# Patient Record
Sex: Female | Born: 2000 | Race: White | Hispanic: No | Marital: Single | Smoking: Never smoker
Health system: Southern US, Community
[De-identification: ages and names within clinical notes are randomized; demographics above are authoritative.]

## PROBLEM LIST (undated history)

## (undated) DIAGNOSIS — Z789 Other specified health status: Secondary | ICD-10-CM

---

## 2021-03-16 ENCOUNTER — Other Ambulatory Visit: Payer: Self-pay

## 2021-03-16 ENCOUNTER — Ambulatory Visit
Admission: EM | Admit: 2021-03-16 | Discharge: 2021-03-16 | Disposition: A | Discharge status: Home / Self Care | Payer: BLUE CROSS/BLUE SHIELD | Attending: Emergency Medicine | Admitting: Emergency Medicine

## 2021-03-16 ENCOUNTER — Emergency Department: Payer: BLUE CROSS/BLUE SHIELD

## 2021-03-16 ENCOUNTER — Emergency Department
Admission: EM | Admit: 2021-03-16 | Discharge: 2021-03-16 | Disposition: A | Discharge status: Home / Self Care | Payer: BLUE CROSS/BLUE SHIELD | Attending: Emergency Medicine | Admitting: Emergency Medicine

## 2021-03-16 ENCOUNTER — Encounter: Payer: Self-pay | Admitting: Emergency Medicine

## 2021-03-16 DIAGNOSIS — J351 Hypertrophy of tonsils: Secondary | ICD-10-CM

## 2021-03-16 DIAGNOSIS — J039 Acute tonsillitis, unspecified: Secondary | ICD-10-CM | POA: Insufficient documentation

## 2021-03-16 DIAGNOSIS — J029 Acute pharyngitis, unspecified: Secondary | ICD-10-CM

## 2021-03-16 HISTORY — DX: Other specified health status: Z78.9

## 2021-03-16 LAB — COMPREHENSIVE METABOLIC PANEL
ALT: 13 U/L (ref 0–44)
AST: 14 U/L — ABNORMAL LOW (ref 15–41)
Albumin: 4.5 g/dL (ref 3.5–5.0)
Alkaline Phosphatase: 62 U/L (ref 38–126)
Anion gap: 11 (ref 5–15)
BUN: 9 mg/dL (ref 6–20)
CO2: 23 mmol/L (ref 22–32)
Calcium: 9.5 mg/dL (ref 8.9–10.3)
Chloride: 101 mmol/L (ref 98–111)
Creatinine, Ser: 0.62 mg/dL (ref 0.44–1.00)
GFR, Estimated: >60 mL/min (ref >60)
Glucose, Bld: 114 mg/dL — ABNORMAL HIGH (ref 70–99)
Potassium: 4 mmol/L (ref 3.5–5.1)
Sodium: 135 mmol/L (ref 135–145)
Total Bilirubin: 1.5 mg/dL — ABNORMAL HIGH (ref 0.3–1.2)
Total Protein: 8.7 g/dL — ABNORMAL HIGH (ref 6.5–8.1)

## 2021-03-16 LAB — CBC WITH DIFFERENTIAL/PLATELET
Abs Immature Granulocytes: 0.1 10*3/uL — ABNORMAL HIGH (ref 0.00–0.07)
Basophils Absolute: 0.1 10*3/uL (ref 0.0–0.1)
Basophils Relative: 0 %
Eosinophils Absolute: 0 10*3/uL (ref 0.0–0.5)
Eosinophils Relative: 0 %
HCT: 42.2 % (ref 36.0–46.0)
Hemoglobin: 15.7 g/dL — ABNORMAL HIGH (ref 12.0–15.0)
Immature Granulocytes: 1 %
Lymphocytes Relative: 9 %
Lymphs Abs: 2 10*3/uL (ref 0.7–4.0)
MCH: 35.2 pg — ABNORMAL HIGH (ref 26.0–34.0)
MCHC: 37.2 g/dL — ABNORMAL HIGH (ref 30.0–36.0)
MCV: 94.6 fL (ref 80.0–100.0)
Monocytes Absolute: 2.2 10*3/uL — ABNORMAL HIGH (ref 0.1–1.0)
Monocytes Relative: 10 %
Neutro Abs: 17.8 10*3/uL — ABNORMAL HIGH (ref 1.7–7.7)
Neutrophils Relative %: 80 %
Platelets: 276 10*3/uL (ref 150–400)
RBC: 4.46 MIL/uL (ref 3.87–5.11)
RDW: 11.2 % — ABNORMAL LOW (ref 11.5–15.5)
WBC: 22.2 10*3/uL — ABNORMAL HIGH (ref 4.0–10.5)
nRBC: 0 % (ref 0.0–0.2)

## 2021-03-16 LAB — MONONUCLEOSIS SCREEN: Mono Screen: NEGATIVE

## 2021-03-16 LAB — GROUP A STREP BY PCR: Group A Strep by PCR: NOT DETECTED

## 2021-03-16 IMAGING — CT CT NECK W/ CM
3 of 4 series · 12 of 33 positions shown, 14 images · IV contrast (omnipaque)
Comparison: None.

CLINICAL DATA: Sore throat, swollen tonsils

EXAM:
CT NECK WITH CONTRAST
TECHNIQUE: Multidetector CT imaging of the neck was performed using the
standard protocol following the bolus administration of intravenous
contrast.
CONTRAST:  75mL OMNIPAQUE IOHEXOL 350 MG/ML SOLN

[Series 2: axial neck · axial · 0.49mm/px · z∈[+237,+397]mm · 4 of 121 slices shown, 5 images]
[im 21/121  soft-tissue]
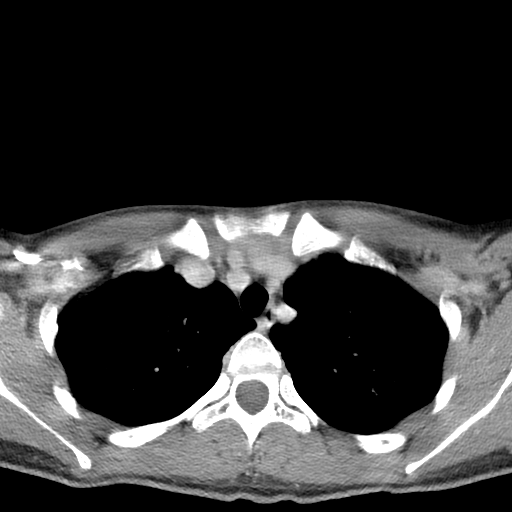
[im 21/121  bone]
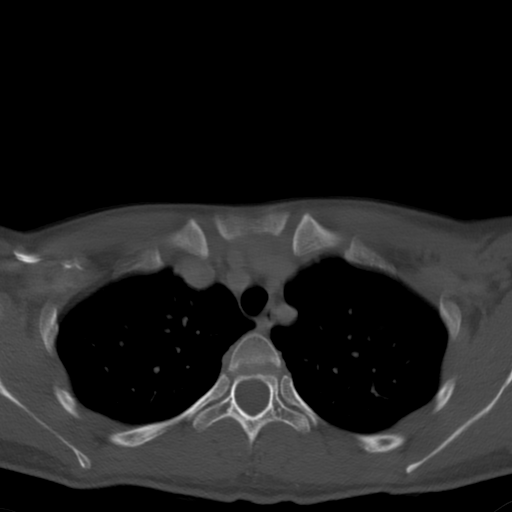
[im 41/121  bone]
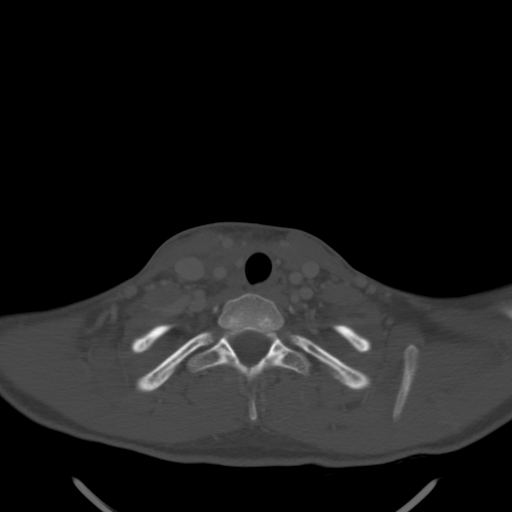
[im 81/121  bone]
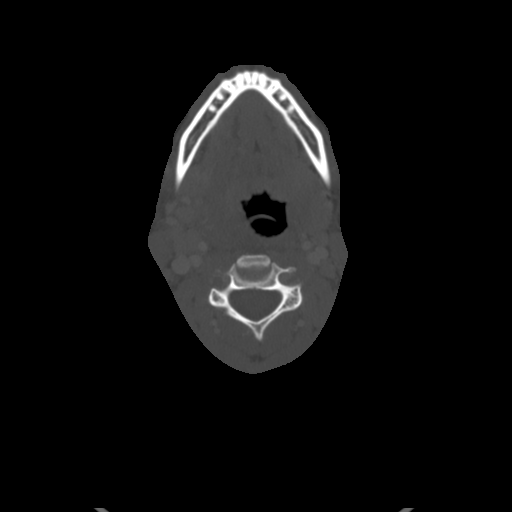
[im 101/121  bone]
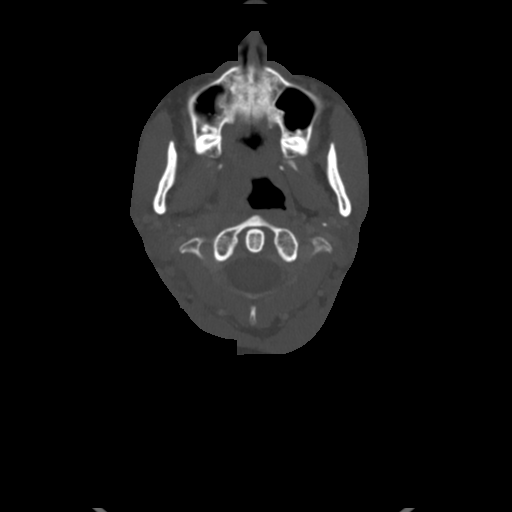

[Series 5: sag neck · sagittal · 0.37mm/px · 5 of 69 slices shown, 6 images]
[im 23/69  bone]
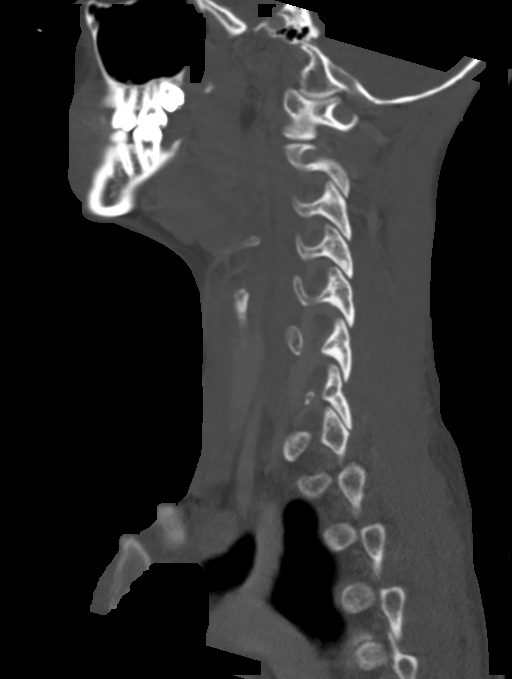
[im 29/69  bone]
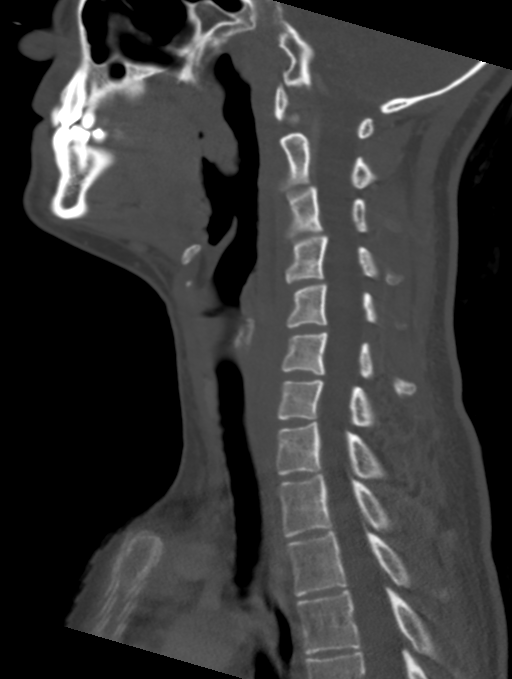
[im 35/69  soft-tissue]
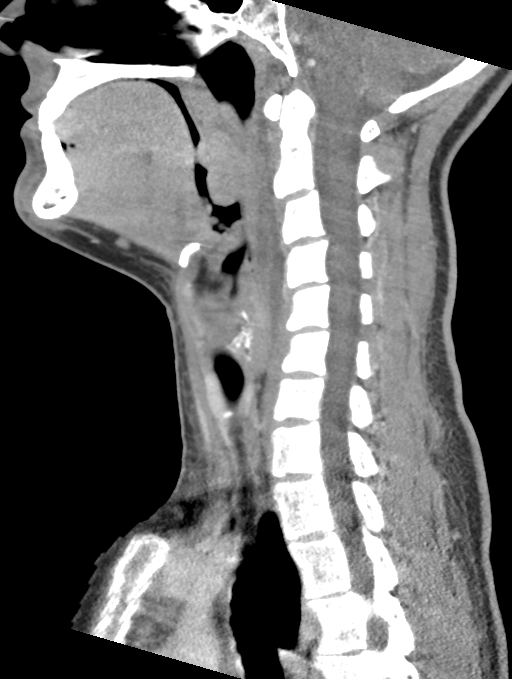
[im 35/69  bone]
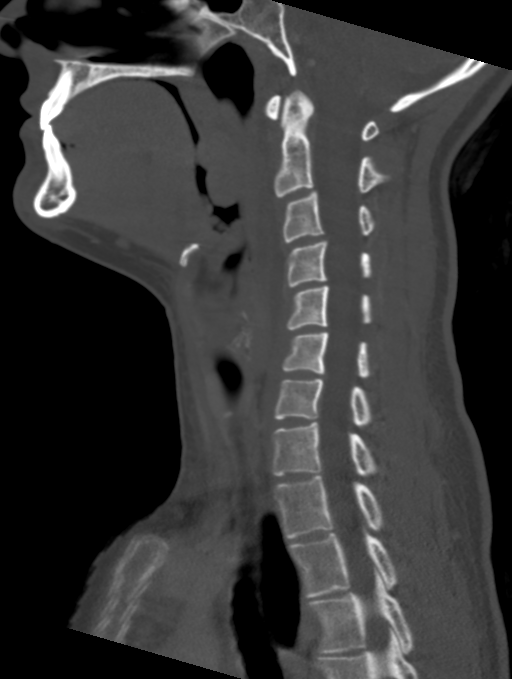
[im 40/69  bone]
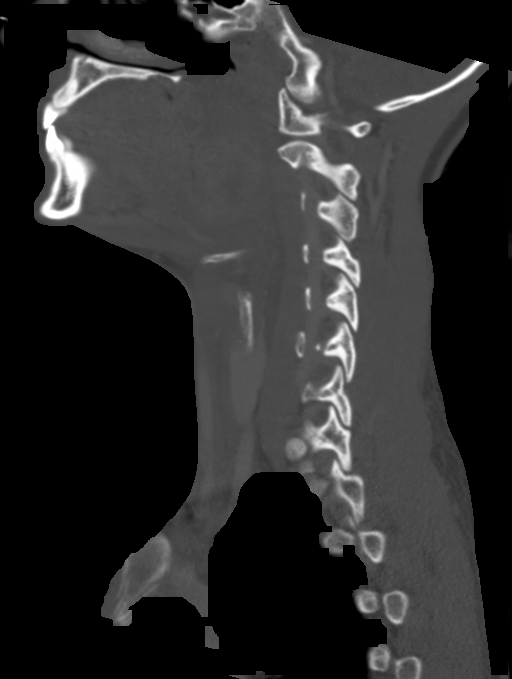
[im 46/69  bone]
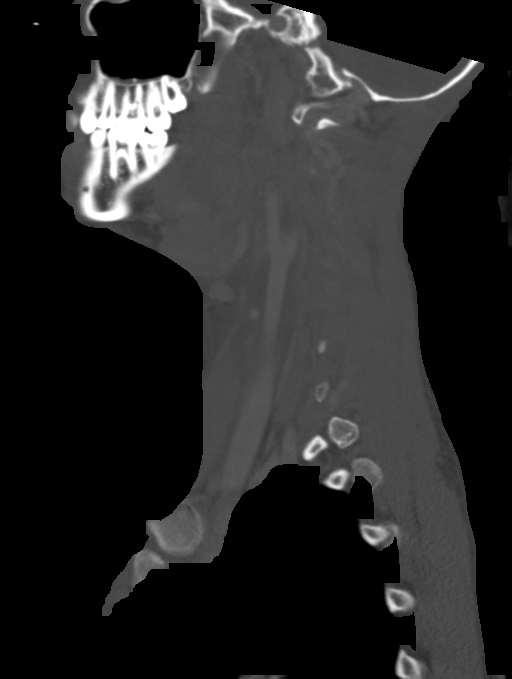

[Series 6: cor neck · coronal · 0.34mm/px · 3 of 95 slices shown]
[im 19/95  bone]
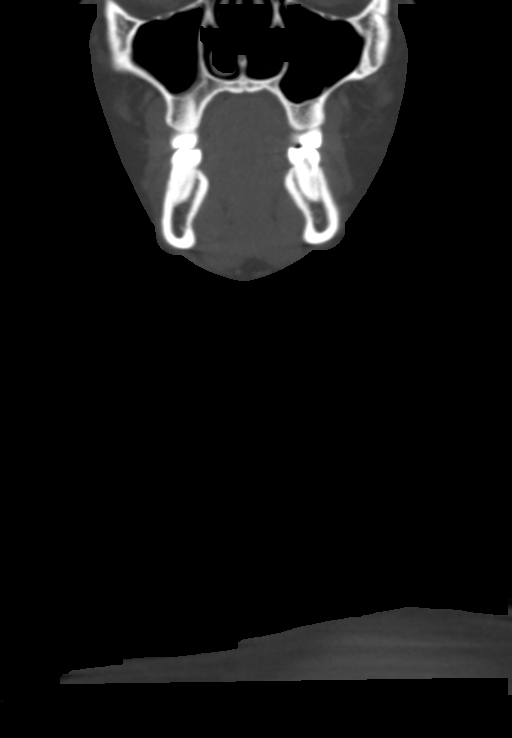
[im 38/95  bone]
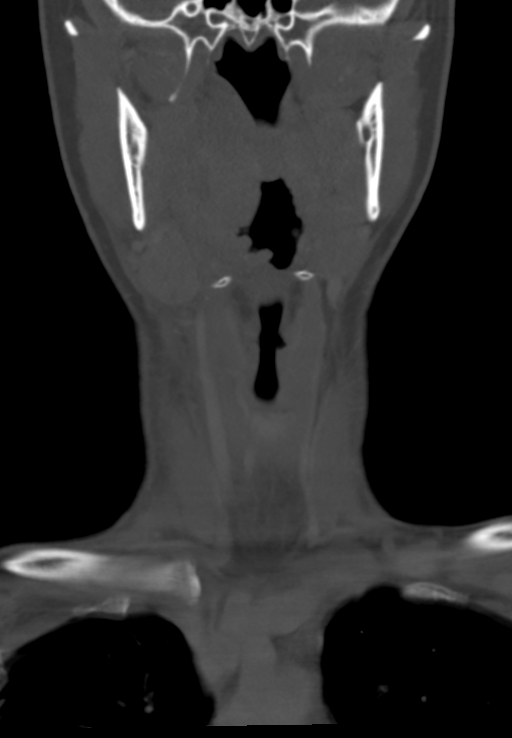
[im 57/95  bone]
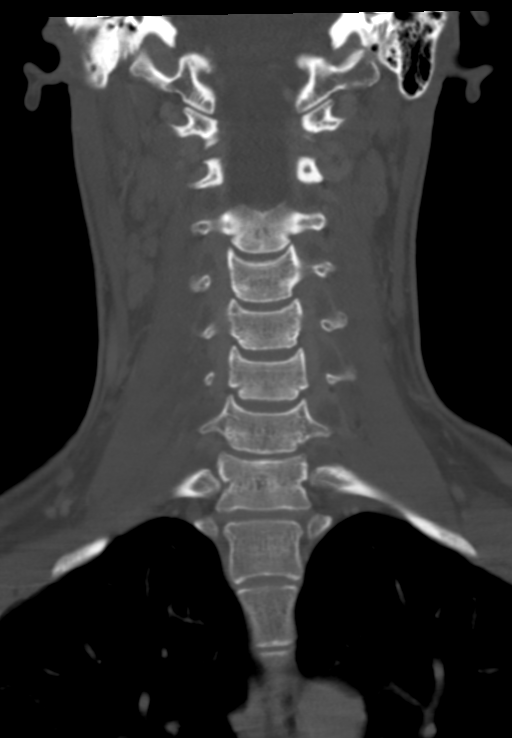

[12 of 33 positions shown; findings below may reference images not displayed]

FINDINGS: Pharynx and larynx: There is swelling of the right palatine tonsil
with an approximately 2.1 cm AP by 1.5 cm TV by 2.3 cm cc area of
hypoattenuation with peripheral enhancement. There is mucosal edema
involving the right aspect of the oropharynx there is mild
effacement of the oropharyngeal airway. There is inflammatory
stranding in the right parapharyngeal fat. The left palatine tonsil
and parapharyngeal space are unremarkable.

The nasal cavity and nasopharynx are normal.

The hypopharynx and larynx are normal.

There is a small retropharyngeal effusion. There is no evidence of
retropharyngeal abscess.

Salivary glands: The parotid and submandibular glands are
unremarkable.

Thyroid: There is a subcentimeter left thyroid nodule. Not
clinically significant; no follow-up imaging recommended (ref: [HOSPITAL]. [DATE]): 143-50).

Lymph nodes: There is a prominent right level II a lymph node
measuring up to 1.2 cm in short axis, likely reactive. There are
scattered subcentimeter left cervical chain lymph nodes.

Vascular: The vasculature is unremarkable.

Limited intracranial: Negative.

Visualized orbits: Negative.

Mastoids and visualized paranasal sinuses: Clear.

Skeleton: There is no acute osseous abnormality or aggressive
osseous lesion.

Upper chest: The lung apices are clear.

Other: None.
IMPRESSION: 1. Right palatine tonsillitis with a 2.1 cm x 1.5 cm x 2.3 cm
phlegmon/early abscess and surrounding mucosal edema. Findings
result in partial effacement of the oropharyngeal airway.
2. Small retropharyngeal effusion.  No retropharyngeal abscess.

## 2021-03-16 MED ORDER — DEXAMETHASONE SODIUM PHOSPHATE 10 MG/ML IJ SOLN
10.0000 mg | Freq: Once | INTRAMUSCULAR | Status: AC
  Administered 2021-03-16: 10 mg via INTRAVENOUS
  Filled 2021-03-16: qty 1

## 2021-03-16 MED ORDER — IOHEXOL 350 MG/ML SOLN
75.0000 mL | Freq: Once | INTRAVENOUS | Status: AC | PRN
  Administered 2021-03-16: 75 mL via INTRAVENOUS

## 2021-03-16 MED ORDER — SODIUM CHLORIDE 0.9 % IV SOLN
3.0000 g | Freq: Once | INTRAVENOUS | Status: AC
  Administered 2021-03-16: 3 g via INTRAVENOUS
  Filled 2021-03-16: qty 8

## 2021-03-16 NOTE — Discharge Instructions (Addendum)
Please call the office first thing in the morning to get an appointment.  Take your antibiotic in the morning then complete the prescription.   Return to the ER tonight if symptoms change or worsen.

## 2021-03-16 NOTE — ED Triage Notes (Signed)
Seen through Urgent Care, sent to ED for evaluation for possible peritonsillar abscess.

## 2021-03-16 NOTE — ED Provider Notes (Signed)
[facility] Emergency Department Provider Note  ____________________________________________  Time seen: Approximately 3:58 PM  I have reviewed the triage vital signs and the nursing notes.   HISTORY  Chief Complaint Sore Throat    HPI Katherine Pollard is a 20 y.o. female presenting to the emergency department for treatment and evaluation of sore throat.  She had an ED visit yesterday and was prescribed Augmentin.  Symptoms worsened and she presented to the urgent care who then sent her to the emergency department for concern of peritonsillar abscess.  Patient denies fever, chills, nausea, vomiting, diarrhea, or exposure to others who are ill.  Past Medical History:  Diagnosis Date   No pertinent past medical history     There are no problems to display for this patient.   History reviewed. No pertinent surgical history.  Prior to Admission medications   Not on File    Allergies Patient has no known allergies.  No family history on file.  Social History Social History   Tobacco Use   Smoking status: Never   Smokeless tobacco: Never  Substance Use Topics   Drug use: Not Currently    Review of Systems Constitutional: Negative for fever. Eyes: No visual changes. ENT: Positive for sore throat; positive for painful swallowing. Respiratory: Denies shortness of breath. Gastrointestinal: Negative for abdominal pain.  No nausea, no vomiting.  No diarrhea.  Genitourinary: Negative for dysuria. Negative for decrease in need to void. Musculoskeletal: Negative for generalized body aches. Skin: Negative for rash. Neurological: Negative for headaches, negative for focal weakness or numbness.  ____________________________________________   PHYSICAL EXAM:  VITAL SIGNS: ED Triage Vitals  Enc Vitals Group     BP 03/16/21 1415 136/77     Pulse Rate 03/16/21 1415 92     Resp 03/16/21 1415 16     Temp 03/16/21 1415 98.7 F (37.1 C)     Temp Source  03/16/21 1415 Oral     SpO2 03/16/21 1415 100 %     Weight 03/16/21 1407 135 lb (61.2 kg)     Height 03/16/21 1407 5\' 6"  (1.676 m)     Head Circumference --      Peak Flow --      Pain Score 03/16/21 1406 7     Pain Loc --      Pain Edu? --      Excl. in GC? --     Constitutional: Alert and oriented. Well appearing and in no acute distress. Eyes: Conjunctivae are normal.  Head: Atraumatic. Nose: No congestion/rhinnorhea. Mouth/Throat: Mucous membranes are moist.  Oropharynx erythematous, tonsils 3+ with exudate. Right larger than left. Uvula is midline. Ears: Right tympanic membrane appears normal. Left tympanic membrane appears normal. Neck: No stridor. Voice raspy Lymphatic: Anterior cervical nodes tender. Cardiovascular: Normal rate, regular rhythm. Good peripheral circulation. Respiratory: Normal respiratory effort. Lungs CTAB. Gastrointestinal: Soft and nontender. Musculoskeletal: FROM of neck, upper and lower extremities. Neurologic:  Normal speech and language. No gross focal neurologic deficits are appreciated. Skin:  Skin is warm, dry and intact. No rash noted Psychiatric: Mood and affect are normal. Speech and behavior are normal.  ____________________________________________   LABS (all labs ordered are listed, but only abnormal results are displayed)  Labs Reviewed  CBC WITH DIFFERENTIAL/PLATELET - Abnormal; Notable for the following components:      Result Value   WBC 22.2 (*)    Hemoglobin 15.7 (*)    MCH 35.2 (*)    MCHC 37.2 (*)  RDW 11.2 (*)    Neutro Abs 17.8 (*)    Monocytes Absolute 2.2 (*)    Abs Immature Granulocytes 0.10 (*)    All other components within normal limits  COMPREHENSIVE METABOLIC PANEL - Abnormal; Notable for the following components:   Glucose, Bld 114 (*)    Total Protein 8.7 (*)    AST 14 (*)    Total Bilirubin 1.5 (*)    All other components within normal limits  GROUP A STREP BY PCR  MONONUCLEOSIS SCREEN    ____________________________________________  EKG  Not indiated ____________________________________________  RADIOLOGY  CT soft tissue neck shows 2 cm early abscess/phlegmon right tonsil ____________________________________________   PROCEDURES  Procedure(s) performed: None  Critical Care performed: No ____________________________________________   INITIAL IMPRESSION / ASSESSMENT AND PLAN / ED COURSE  20 year old female presenting to the emergency department for treatment and evaluation of sore throat.  She was prescribed Augmentin and had taken 1 dose prior to being evaluated at urgent care.  She was sent to the emergency department for concern of peritonsillar abscess.  She is able to swallow saliva and fluids.  CT shows a 2 cm phlegmon/early abscess.  She was given Decadron and Unasyn while here.  Case discussed with Dr. Willeen Cass who will see her in the office tomorrow.  Patient is agreeable to the plan.  She will be given a school excuse for tomorrow.  She was advised that if any symptom changes or worsens overnight that she is to return to the emergency department.  Pertinent labs & imaging results that were available during my care of the patient were reviewed by me and considered in my medical decision making (see chart for details). ____________________________________________  New Prescriptions   No medications on file    FINAL CLINICAL IMPRESSION(S) / ED DIAGNOSES  Final diagnoses:  Acute tonsillitis, unspecified etiology    If controlled substance prescribed during this visit, 12 month history viewed on the [facility] prior to issuing an initial prescription for Schedule II or III opiod.   Note:  This document was prepared using Dragon voice recognition software and may include unintentional dictation errors.    Chinita Pester, FNP 03/16/21 1731    Georga Hacking, MD 03/17/21 [phone]

## 2021-03-16 NOTE — ED Provider Notes (Signed)
  HPI  SUBJECTIVE:  Patient reports sore throat, tonsillar swelling starting 5 days ago.  She reports a raspy voice, difficulty swallowing secondary to the swelling, which she states is worse on the right side.  No difficulty breathing.  She reports trismus, painful swelling under her jaw worse on the right.  She had an E-visit yesterday, was prescribed amoxicillin, she took first dose last night, but was getting worse, so came in for evaluation.  No fevers, body aches, headaches, nasal congestion, loss of sense of smell or taste, cough, shortness of breath, nausea, vomiting, diarrhea, abdominal pain, rash, drooling, neck stiffness.  No muffled voice.  No antipyretic in the past 6 hours.  No exposure to strep, mono, COVID.  She did not get the COVID-vaccine.  She took Tylenol and amoxicillin.  Tylenol helps.  Symptoms worse with talking and swallowing.  She has no past medical history.  LMP: Denies possibility of being pregnant.  PMD: At home-is in school here   Past Medical History:  Diagnosis Date   No pertinent past medical history     History reviewed. No pertinent surgical history.  History reviewed. No pertinent family history.  Social History   Tobacco Use   Smoking status: Never   Smokeless tobacco: Never  Substance Use Topics   Drug use: Not Currently    No current facility-administered medications for this encounter. No current outpatient medications on file.  No Known Allergies   ROS  As noted in HPI.   Physical Exam  BP 126/86   Pulse 97   Temp 98.4 F (36.9 C)   Resp 20   SpO2 100%   Constitutional: Well developed, well nourished, no acute distress Eyes:  EOMI, conjunctiva normal bilaterally HENT: Normocephalic, atraumatic,mucus membranes moist.  Very enlarged, almost kissing tonsils, worse on the right.  No exudates.  Positive tender soft palate erythema, fullness, tenderness.  Uvula slightly deviated to the left.  Mild trismus. Lymph: Positive large lymph  node underneath jaw, positive anterior, posterior cervical adenopathy Respiratory: Normal inspiratory effort Cardiovascular: Normal rate, no murmurs, rubs, gallops GI: nondistended, nontender.  Musculoskeletal: no deformities Neurologic: Alert & oriented x 3, no focal neuro deficits Psychiatric: Speech and behavior appropriate.    ED Course   Medications - No data to display  No orders of the defined types were placed in this encounter.   No results found for this or any previous visit (from the past 24 hour(s)). No results found.  ED Clinical Impression  1. Acute pharyngitis, unspecified etiology   2. Swelling of tonsil      ED Assessment/Plan  Concern for peritonsillar abscess.  However airway is patent.  Feel that she is stable to go by private vehicle.  Transferring to the emergency department for further work-up, treatment.  No orders of the defined types were placed in this encounter.    *This clinic note was created using Dragon dictation software. Therefore, there may be occasional mistakes despite careful proofreading.     Domenick Gong, MD 03/16/21 1149

## 2021-03-16 NOTE — Discharge Instructions (Addendum)
I am concerned that you have a peritonsillar abscess, which usually requires IV antibiotics and sometimes needs surgery.  Do not have anything to eat or drink until your ER evaluation is complete.

## 2021-03-16 NOTE — ED Triage Notes (Signed)
Pt here with sore throat and swollen tonsils since Friday. Had a virtual visit yesterday and was prescribed abx which she took one dose of and woke up this morning with worse swelling. This RN asked about swabbing for strep to see if the abx are the appropriate treatment and pt did not want one.

## 2021-06-15 ENCOUNTER — Other Ambulatory Visit: Payer: Self-pay

## 2021-06-15 ENCOUNTER — Ambulatory Visit
Admission: EM | Admit: 2021-06-15 | Discharge: 2021-06-15 | Disposition: A | Discharge status: Home / Self Care | Payer: BLUE CROSS/BLUE SHIELD | Attending: Emergency Medicine | Admitting: Emergency Medicine

## 2021-06-15 ENCOUNTER — Encounter: Payer: Self-pay | Admitting: Emergency Medicine

## 2021-06-15 DIAGNOSIS — J020 Streptococcal pharyngitis: Secondary | ICD-10-CM | POA: Diagnosis not present

## 2021-06-15 DIAGNOSIS — B349 Viral infection, unspecified: Secondary | ICD-10-CM

## 2021-06-15 LAB — POCT RAPID STREP A (OFFICE): Rapid Strep A Screen: POSITIVE — AB

## 2021-06-15 MED ORDER — AMOXICILLIN 875 MG PO TABS: 875.0000 mg | ORAL_TABLET | Freq: Two times a day (BID) | ORAL | 0 refills | Status: AC

## 2021-06-15 NOTE — Discharge Instructions (Addendum)
Your strep test is positive.  Take the amoxicillin as directed.  Follow up with an ENT.

## 2021-06-15 NOTE — ED Triage Notes (Signed)
Pt here with right sided sore throat. Was dx with early peritonsillar abscess about a month ago and was given IV abx in the ED. States this feels a lot like that.

## 2021-06-15 NOTE — ED Provider Notes (Signed)
Renaldo Fiddler    CSN: [id number] Arrival date & time: 06/15/21  1717      History   Chief Complaint Chief Complaint  Patient presents with   Sore Throat     HPI Katherine Pollard is a 20 y.o. female.  Patient presents with 3-day history of sore throat.  No fever, difficulty swallowing, voice change, rash, shortness of breath, or other symptoms.  Treatment at home with OTC pain reliever.  She was seen at this urgent care on 03/16/2021; diagnosed acute pharyngitis and swelling of tonsil; the provider was concerned for peritonsillar abscess and sent her to the ED.  She was seen at [facility] ED the same day; CT scan showed 2 cm early abscess; treated with Decadron and Unasyn; discharged home on Augmentin that had been prescribed on e-visit.   The history is provided by the patient and medical records.   Past Medical History:  Diagnosis Date   No pertinent past medical history     There are no problems to display for this patient.   History reviewed. No pertinent surgical history.  OB History   No obstetric history on file.      Home Medications    Prior to Admission medications   Medication Sig Start Date End Date Taking? Authorizing Provider  amoxicillin (AMOXIL) 875 MG tablet Take 1 tablet (875 mg total) by mouth 2 (two) times daily for 10 days. 06/15/21 06/25/21 Yes Mickie Bail, NP    Family History History reviewed. No pertinent family history.  Social History Social History   Tobacco Use   Smoking status: Never   Smokeless tobacco: Never  Substance Use Topics   Drug use: Not Currently     Allergies   Patient has no known allergies.   Review of Systems Review of Systems  Constitutional:  Negative for chills and fever.  HENT:  Positive for sore throat. Negative for ear pain.   Respiratory:  Negative for cough and shortness of breath.   Cardiovascular:  Negative for chest pain and palpitations.  Gastrointestinal:  Negative for diarrhea and  vomiting.  Skin:  Negative for color change and rash.  All other systems reviewed and are negative.   Physical Exam Triage Vital Signs ED Triage Vitals  Enc Vitals Group     BP      Pulse      Resp      Temp      Temp src      SpO2      Weight      Height      Head Circumference      Peak Flow      Pain Score      Pain Loc      Pain Edu?      Excl. in GC?    No data found.  Updated Vital Signs BP (!) 155/83 (BP Location: Left Arm)   Pulse 99   Temp 98 F (36.7 C) (Oral)   Resp 18   SpO2 98%   Visual Acuity Right Eye Distance:   Left Eye Distance:   Bilateral Distance:    Right Eye Near:   Left Eye Near:    Bilateral Near:     Physical Exam Vitals and nursing note reviewed.  Constitutional:      General: She is not in acute distress.    Appearance: She is well-developed.  HENT:     Right Ear: Tympanic membrane normal.     Left Ear:  Tympanic membrane normal.     Nose: Nose normal.     Mouth/Throat:     Mouth: Mucous membranes are moist.     Pharynx: Uvula midline. Posterior oropharyngeal erythema present.     Tonsils: 2+ on the right. 1+ on the left.  Cardiovascular:     Rate and Rhythm: Normal rate and regular rhythm.     Heart sounds: No murmur heard. Pulmonary:     Effort: Pulmonary effort is normal. No respiratory distress.     Breath sounds: Normal breath sounds.  Musculoskeletal:     Cervical back: Neck supple.  Skin:    General: Skin is warm and dry.  Neurological:     Mental Status: She is alert.  Psychiatric:        Mood and Affect: Mood normal.        Behavior: Behavior normal.     UC Treatments / Results  Labs (all labs ordered are listed, but only abnormal results are displayed) Labs Reviewed  POCT RAPID STREP A (OFFICE) - Abnormal; Notable for the following components:      Result Value   Rapid Strep A Screen Positive (*)    All other components within normal limits    EKG   Radiology No results  found.  Procedures Procedures (including critical care time)  Medications Ordered in UC Medications - No data to display  Initial Impression / Assessment and Plan / UC Course  I have reviewed the triage vital signs and the nursing notes.  Pertinent labs & imaging results that were available during my care of the patient were reviewed by me and considered in my medical decision making (see chart for details).    Strep pharyngitis.  Rapid strep positive.  Treating with amoxicillin.  Discussed Tylenol or ibuprofen as needed for discomfort.  Instructed patient to follow up with ENT due to her recent history and current strep.  She agrees to plan of care.     Final Clinical Impressions(s) / UC Diagnoses   Final diagnoses:  Strep pharyngitis     Discharge Instructions      Your strep test is positive.  Take the amoxicillin as directed.  Follow up with an ENT.         ED Prescriptions     Medication Sig Dispense Auth. Provider   amoxicillin (AMOXIL) 875 MG tablet Take 1 tablet (875 mg total) by mouth 2 (two) times daily for 10 days. 20 tablet Mickie Bail, NP      PDMP not reviewed this encounter.   Mickie Bail, NP 06/15/21 1758

## 2022-07-14 ENCOUNTER — Telehealth: Payer: Self-pay | Admitting: Urgent Care

## 2022-07-14 ENCOUNTER — Ambulatory Visit
Admission: RE | Admit: 2022-07-14 | Discharge: 2022-07-14 | Disposition: A | Discharge status: Home / Self Care | Payer: BLUE CROSS/BLUE SHIELD | Source: Ambulatory Visit | Attending: Urgent Care | Admitting: Urgent Care

## 2022-07-14 VITALS — BP 155/89 | HR 120 | Temp 98.3°F | Resp 20

## 2022-07-14 DIAGNOSIS — J039 Acute tonsillitis, unspecified: Secondary | ICD-10-CM | POA: Insufficient documentation

## 2022-07-14 LAB — POCT RAPID STREP A (OFFICE): Rapid Strep A Screen: NEGATIVE

## 2022-07-14 MED ORDER — PREDNISONE 20 MG PO TABS: ORAL_TABLET | ORAL | 0 refills | Status: AC

## 2022-07-14 MED ORDER — AMOXICILLIN-POT CLAVULANATE 875-125 MG PO TABS
1.0000 | ORAL_TABLET | Freq: Two times a day (BID) | ORAL | 0 refills | Status: DC
Start: 2022-07-14 — End: 2022-07-14

## 2022-07-14 MED ORDER — AMOXICILLIN-POT CLAVULANATE 600-42.9 MG/5ML PO SUSR: 875.0000 mg | Freq: Two times a day (BID) | ORAL | 0 refills | Status: AC

## 2022-07-14 NOTE — Telephone Encounter (Signed)
Called to request liquid suspension of prescribed Augmentin rather than tablets for difficult swallowing.

## 2022-07-14 NOTE — ED Triage Notes (Signed)
Pt. Presents to UC w/ c/o swollen tonsils. Pt. Has  chronic/ recurrent swelling of her tonsils. Pt. States in the past she has been treated with antibiotics and states there are specific medications to treat her tonsillar inflammation. Pt. Dos currently see an ENT specialist for this issue.

## 2022-07-14 NOTE — ED Provider Notes (Signed)
Katherine Pollard    CSN: [id number] Arrival date & time: 07/14/22  1057      History   Chief Complaint Chief Complaint  Patient presents with   Sore Throat    Entered by patient    HPI Katherine Pollard is a 22 y.o. female.    Sore Throat    This to urgent care with complaint of swollen tonsils.  Patient reports chronic/recurrent swelling of her tonsils.  She states that she has been treated with antibiotics in the past and thinks there are other specific medications that have treated her tonsillar inflammation in the past.  She sees an ENT specialist who suggested treatment with a steroid and antibiotic if it happened again.  Past Medical History:  Diagnosis Date   No pertinent past medical history     There are no problems to display for this patient.   History reviewed. No pertinent surgical history.  OB History   No obstetric history on file.      Home Medications    Prior to Admission medications   Medication Sig Start Date End Date Taking? Authorizing Provider  ARAZLO 0.045 % LOTN Apply topically. 02/16/22  Yes [provider]  clindamycin (CLINDAGEL) 1 % gel  02/19/20  Yes [provider]  tretinoin (RETIN-A) 0.01 % gel  02/19/20  Yes [provider]  WINLEVI 1 % CREA Apply topically 2 (two) times daily. 02/16/22  Yes [provider]    Family History History reviewed. No pertinent family history.  Social History Social History   Tobacco Use   Smoking status: Never   Smokeless tobacco: Never  Substance Use Topics   Drug use: Not Currently     Allergies   Patient has no known allergies.   Review of Systems Review of Systems   Physical Exam Triage Vital Signs ED Triage Vitals [07/14/22 1116]  Enc Vitals Group     BP (!) 155/89     Pulse Rate (!) 120     Resp 20     Temp 98.3 F (36.8 C)     Temp src      SpO2 98 %     Weight      Height      Head Circumference      Peak Flow      Pain Score  0     Pain Loc      Pain Edu?      Excl. in [place]?    No data found.  Updated Vital Signs BP (!) 155/89   Pulse (!) 120   Temp 98.3 F (36.8 C)   Resp 20   LMP 06/29/2022 (Approximate)   SpO2 98%   Visual Acuity Right Eye Distance:   Left Eye Distance:   Bilateral Distance:    Right Eye Near:   Left Eye Near:    Bilateral Near:     Physical Exam Vitals reviewed.  Constitutional:      Appearance: She is well-developed.  HENT:     Mouth/Throat:     Pharynx: Posterior oropharyngeal erythema present.     Tonsils: Tonsillar exudate present. 3+ on the right. 3+ on the left.  Skin:    General: Skin is warm and dry.  Neurological:     General: No focal deficit present.     Mental Status: She is alert and oriented to person, place, and time.  Psychiatric:        Mood and Affect: Mood normal.  Behavior: Behavior normal.      UC Treatments / Results  Labs (all labs ordered are listed, but only abnormal results are displayed) Labs Reviewed - No data to display  EKG   Radiology No results found.  Procedures Procedures (including critical care time)  Medications Ordered in UC Medications - No data to display  Initial Impression / Assessment and Plan / UC Course  I have reviewed the triage vital signs and the nursing notes.  Pertinent labs & imaging results that were available during my care of the patient were reviewed by me and considered in my medical decision making (see chart for details).   Patient is afebrile here without recent antipyretics. Satting well on room air. Overall is well appearing, well hydrated, without respiratory distress.   Pharynx is erythematous with 3+ tonsils bilaterally.  There is peritonsillar exudate versus tonsillar stones on the right tonsil.  I recommended swabbing for strep however patient emphatically states that she "knows it is not strep" and initially refuses the throat swab stating that she knows she "needs to be  treated with an antibiotic and a steroid" across "it is the only thing that works".  She states that she does not understand why she needs to be swabbed since other providers have just given her the medication that she needs.  She tries to show me her phone which shows a prescription or encounter with a provider in [place].  I explained to the patient that I needed to have evidence of a bacterial infection or at least attempt to obtain some before treating her with an antibiotic.  She states "I obviously have tonsillitis.  Is into tonsillitis an infection?"  I explained that tonsillitis is a medical term for inflammation of the tonsils which can be caused by nonbacterial causes as well as bacterial, for example a virus which could not be treated with an antibiotic.  The patient states she does not want to be swabbed because it is "uncomfortable".  I pointed out that she was willing to have an injection which is also uncomfortable.  I also stated that her oropharynx is wide open and obtaining a swab is going to be very simple and the discomfort minimum.  She consented to the swab but then insisted on self-swabbing which the nurse observed to be inadequate as it barely contacted the tonsil.  Returned to the exam room to again explain why it was necessary to rule out strep as an infectious agent. Spent quite a bit of time with her calming her (she was tearful) and reassuring her that this was an important part of the assessment.  Initial rapid strep is negative as expected. Second rapid strep is negative as well. Will send strep culture to verify diagnosis. Will treat presumptively for strep with amox-clav x 10 days, and discharge with course of oral prednisone. I do not see a need for decadron in clinic as she denies being "bothered" by her symptoms.   Final Clinical Impressions(s) / UC Diagnoses   Final diagnoses:  None   Discharge Instructions   None    ED Prescriptions   None    PDMP not  reviewed this encounter.   Rose Phi, [place] 07/14/22 1223

## 2022-07-14 NOTE — Discharge Instructions (Addendum)
Recommend follow-up with your ENT provider as needed.

## 2022-07-16 LAB — CULTURE, GROUP A STREP (THRC)

## 2023-05-28 ENCOUNTER — Encounter: Payer: Self-pay | Admitting: Physician Assistant

## 2023-05-28 ENCOUNTER — Ambulatory Visit (INDEPENDENT_AMBULATORY_CARE_PROVIDER_SITE_OTHER): Payer: BLUE CROSS/BLUE SHIELD | Admitting: Physician Assistant

## 2023-05-28 ENCOUNTER — Other Ambulatory Visit: Payer: Self-pay | Admitting: Medical

## 2023-05-28 VITALS — HR 97 | Temp 99.1°F | Ht 66.0 in | Wt 158.0 lb

## 2023-05-28 DIAGNOSIS — H66003 Acute suppurative otitis media without spontaneous rupture of ear drum, bilateral: Secondary | ICD-10-CM

## 2023-05-28 DIAGNOSIS — H6123 Impacted cerumen, bilateral: Secondary | ICD-10-CM | POA: Diagnosis not present

## 2023-05-28 MED ORDER — AMOXICILLIN-POT CLAVULANATE 600-42.9 MG/5ML PO SUSR: 90.0000 mg/kg/d | Freq: Two times a day (BID) | ORAL | 0 refills | Status: DC

## 2023-05-28 MED ORDER — AMOXICILLIN-POT CLAVULANATE 400-57 MG/5ML PO SUSR: 875.0000 mg | Freq: Two times a day (BID) | ORAL | 0 refills | Status: AC

## 2023-05-28 MED ORDER — DEBROX 6.5 % OT SOLN: 5.0000 [drp] | Freq: Two times a day (BID) | OTIC | 0 refills | Status: AC

## 2023-05-28 NOTE — Progress Notes (Signed)
[facility] [address] [place], [place] [address] Phone: [phone] Fax: [phone]   Office Visit Note  Patient Name: Katherine Pollard  Date of QIHKV:[id number]  Med Rec number [id number]   Patient has no known allergies.  Chief Complaint  Patient presents with   Ear Pain   Last Friday had sinus congestion [place] - feels like both ears are full  Then pain on the left - took ibuprofen   Still has sinus congestion   Difficult to hear out of her ears  Unable to tolerate pills - needs suspension  Current Medication:  Outpatient Encounter Medications as of 05/28/2023  Medication Sig   amoxicillin-clavulanate (AUGMENTIN) 600-42.9 MG/5ML suspension Take 26.9 mLs (3,228 mg total) by mouth 2 (two) times daily for 10 days.   carbamide peroxide (DEBROX) 6.5 % OTIC solution Place 5 drops into both ears 2 (two) times daily.   ARAZLO 0.045 % LOTN Apply topically. (Patient not taking: Reported on 05/28/2023)   clindamycin (CLINDAGEL) 1 % gel  (Patient not taking: Reported on 05/28/2023)   tretinoin (RETIN-A) 0.01 % gel  (Patient not taking: Reported on 05/28/2023)   WINLEVI 1 % CREA Apply topically 2 (two) times daily. (Patient not taking: Reported on 05/28/2023)   No facility-administered encounter medications on file as of 05/28/2023.      Medical History: Past Medical History:  Diagnosis Date   No pertinent past medical history      Vital Signs: Pulse 97   Temp 99.1 F (37.3 C) (Tympanic)   Ht 5\' 6"  (1.676 m)   Wt 158 lb (71.7 kg)   SpO2 100%   BMI 25.50 kg/m    ROS negative unless otherwise indicated above.  Physical Exam Vitals reviewed.  HENT:     Right Ear: Ear canal and external ear normal. No laceration, drainage, swelling or tenderness. There is no impacted cerumen. No foreign body. Tympanic membrane is injected, erythematous and bulging. Tympanic membrane is not perforated.     Left Ear: Ear  canal and external ear normal. No laceration, drainage, swelling or tenderness. There is no impacted cerumen. No foreign body. Tympanic membrane is injected, erythematous and bulging. Tympanic membrane is not perforated.     Ears:     Comments: Heavy cerumen of both ears but not impacted and able to visualize the TM    Nose: Nose normal. No mucosal edema, congestion or rhinorrhea.     Right Nostril: No epistaxis.     Left Nostril: No epistaxis.     Right Turbinates: Not enlarged or swollen.     Left Turbinates: Not enlarged or swollen.     Right Sinus: No maxillary sinus tenderness or frontal sinus tenderness.     Left Sinus: No maxillary sinus tenderness or frontal sinus tenderness.     Mouth/Throat:     Pharynx: No oropharyngeal exudate or posterior oropharyngeal erythema.     Tonsils: No tonsillar exudate or tonsillar abscesses.  Eyes:     Pupils: Pupils are equal, round, and reactive to light.  Cardiovascular:     Rate and Rhythm: Normal rate and regular rhythm.     Heart sounds: Normal heart sounds. No murmur heard.    No friction rub.  Pulmonary:     Effort: Pulmonary effort is normal. No respiratory distress.     Breath sounds: No stridor. No wheezing, rhonchi or rales.  Musculoskeletal:     Cervical back: No tenderness.  Lymphadenopathy:  Cervical: No cervical adenopathy.  Neurological:     Mental Status: She is alert.  Psychiatric:        Behavior: Behavior normal.     No results found for this or any previous visit (from the past 24 hour(s)).   Assessment/Plan:  1. Non-recurrent acute suppurative otitis media of both ears without spontaneous rupture of tympanic membranes - amoxicillin-clavulanate (AUGMENTIN) 600-42.9 MG/5ML suspension; Take 26.9 mLs (3,228 mg total) by mouth 2 (two) times daily for 10 days.  Dispense: 200 mL; Refill: 0  As above, suspension prescribed as pt is unable to tolerate pills and requires suspension  Finish all antibiotics as  prescribed, even when you are feeling better. Take antibiotic with food. Send secure message to provider or schedule return appointment as needed for new/worsening symptoms or if your symptoms are not improving after 2-3 days on the antibiotic.    2. Cerumen debris on tympanic membrane of both ears - carbamide peroxide (DEBROX) 6.5 % OTIC solution; Place 5 drops into both ears 2 (two) times daily.  Dispense: 15 mL; Refill: 0   If debrox does not successfully remove cerumen, we discussed that she should come back for bilateral ear lavage.    General Counseling: Carlina verbalizes understanding of the findings of todays visit and agrees with plan of treatment.   she has been encouraged to call the office with any questions or concerns that should arise related to todays visit.    No orders of the defined types were placed in this encounter.   Meds ordered this encounter  Medications   carbamide peroxide (DEBROX) 6.5 % OTIC solution    Sig: Place 5 drops into both ears 2 (two) times daily.    Dispense:  15 mL    Refill:  0    Order Specific Question:   Supervising Provider    Answer:   Erasmo Downer [[id number]]   amoxicillin-clavulanate (AUGMENTIN) 600-42.9 MG/5ML suspension    Sig: Take 26.9 mLs (3,228 mg total) by mouth 2 (two) times daily for 10 days.    Dispense:  200 mL    Refill:  0    Order Specific Question:   Supervising Provider    Answer:   Erasmo Downer [[id number]]      Signed, Lennon Alstrom, PA-C 05/28/2023, 4:06 PM

## 2023-07-17 ENCOUNTER — Encounter: Payer: Self-pay | Admitting: Adult Health

## 2023-07-17 ENCOUNTER — Ambulatory Visit (INDEPENDENT_AMBULATORY_CARE_PROVIDER_SITE_OTHER): Payer: BLUE CROSS/BLUE SHIELD | Admitting: Adult Health

## 2023-07-17 VITALS — HR 110 | Temp 98.1°F | Ht 66.0 in | Wt 160.0 lb

## 2023-07-17 DIAGNOSIS — S90821A Blister (nonthermal), right foot, initial encounter: Secondary | ICD-10-CM | POA: Diagnosis not present

## 2023-07-17 NOTE — Progress Notes (Signed)
 [facility] [address] [place], [place] [address] Phone: [phone] Fax: [phone]   Office Visit Note  Patient Name: Katherine Pollard  Date of Apmuy:[id number]  Med Rec number [id number]  Date of Service: 07/17/2023  Patient has no known allergies.  Chief Complaint  Patient presents with   Blister    Right heel      HPI  Patient purchased new running shoes.  She noticed a blister forming on the right heal (medial) about 2 weeks ago. She has continued to run.  Thinks it should be popped so it does not get infected.   Current Medication:  Outpatient Encounter Medications as of 07/17/2023  Medication Sig   ARAZLO 0.045 % LOTN Apply topically. (Patient not taking: Reported on 07/17/2023)   carbamide peroxide (DEBROX) 6.5 % OTIC solution Place 5 drops into both ears 2 (two) times daily. (Patient not taking: Reported on 07/17/2023)   clindamycin (CLINDAGEL) 1 % gel  (Patient not taking: Reported on 07/17/2023)   tretinoin (RETIN-A) 0.01 % gel  (Patient not taking: Reported on 07/17/2023)   WINLEVI 1 % CREA Apply topically 2 (two) times daily. (Patient not taking: Reported on 07/17/2023)   No facility-administered encounter medications on file as of 07/17/2023.      Medical History: Past Medical History:  Diagnosis Date   No pertinent past medical history      Vital Signs: Pulse (!) 110   Temp 98.1 F (36.7 C) (Tympanic)   Ht 5' 6 (1.676 m)   Wt 160 lb (72.6 kg)   SpO2 98%   BMI 25.82 kg/m    Review of Systems  Constitutional:  Negative for chills, fatigue and fever.  Skin:        Blister on medial aspect of right heel.     Physical Exam Musculoskeletal:       Feet:  Feet:     Comments: Blister at above noted site.    Assessment/Plan: 1. Blister of right foot, initial encounter (Primary) Cover during the day. Do not intentionall pop.  Keep clean and dry.  Leave open to air at night.      General Counseling: [name] verbalizes understanding of the  findings of todays visit and agrees with plan of treatment. I have discussed any further diagnostic evaluation that may be needed or ordered today. We also reviewed her medications today. she has been encouraged to call the office with any questions or concerns that should arise related to todays visit.   No orders of the defined types were placed in this encounter.   No orders of the defined types were placed in this encounter.   Time spent:15 Minutes Time spent includes review of chart, medications, test results, and follow up plan with the patient.    Katherine Pollard AGNP-C Nurse Practitioner
